# Patient Record
Sex: Female | Born: 1955 | Race: White | Hispanic: No
Health system: Southern US, Community
[De-identification: ages and names within clinical notes are randomized; demographics above are authoritative.]

---

## 2020-05-31 ENCOUNTER — Emergency Department (HOSPITAL_COMMUNITY): Payer: No Typology Code available for payment source

## 2020-05-31 ENCOUNTER — Emergency Department (HOSPITAL_COMMUNITY)
Admission: EM | Admit: 2020-05-31 | Discharge: 2020-05-31 | Disposition: A | Discharge status: Home / Self Care | Payer: No Typology Code available for payment source | Attending: Emergency Medicine | Admitting: Emergency Medicine

## 2020-05-31 ENCOUNTER — Other Ambulatory Visit: Payer: Self-pay

## 2020-05-31 DIAGNOSIS — S80212A Abrasion, left knee, initial encounter: Secondary | ICD-10-CM | POA: Insufficient documentation

## 2020-05-31 DIAGNOSIS — S80211A Abrasion, right knee, initial encounter: Secondary | ICD-10-CM | POA: Diagnosis not present

## 2020-05-31 DIAGNOSIS — Y939 Activity, unspecified: Secondary | ICD-10-CM | POA: Diagnosis not present

## 2020-05-31 DIAGNOSIS — T07XXXA Unspecified multiple injuries, initial encounter: Secondary | ICD-10-CM

## 2020-05-31 DIAGNOSIS — Y929 Unspecified place or not applicable: Secondary | ICD-10-CM | POA: Insufficient documentation

## 2020-05-31 DIAGNOSIS — S39012A Strain of muscle, fascia and tendon of lower back, initial encounter: Secondary | ICD-10-CM | POA: Diagnosis not present

## 2020-05-31 DIAGNOSIS — R519 Headache, unspecified: Secondary | ICD-10-CM | POA: Insufficient documentation

## 2020-05-31 DIAGNOSIS — S50811A Abrasion of right forearm, initial encounter: Secondary | ICD-10-CM | POA: Diagnosis not present

## 2020-05-31 DIAGNOSIS — Y998 Other external cause status: Secondary | ICD-10-CM | POA: Diagnosis not present

## 2020-05-31 MED ORDER — ACETAMINOPHEN 500 MG PO TABS
1000.0000 mg | ORAL_TABLET | Freq: Once | ORAL | Status: AC
  Administered 2020-05-31: 1000 mg via ORAL
  Filled 2020-05-31: qty 2

## 2020-05-31 MED ORDER — HYDROCODONE-ACETAMINOPHEN 5-325 MG PO TABS: 1.0000 | ORAL_TABLET | Freq: Four times a day (QID) | ORAL | 0 refills | Status: DC | PRN

## 2020-05-31 MED ORDER — HYDROCODONE-ACETAMINOPHEN 5-325 MG PO TABS: 1.0000 | ORAL_TABLET | Freq: Four times a day (QID) | ORAL | 0 refills | Status: AC | PRN
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
## 2020-05-31 NOTE — ED Notes (Signed)
Patient transported to CT 

## 2020-05-31 NOTE — ED Notes (Signed)
Discharge instructions discussed with pt. Pt verbalized understanding with no questions at this time. Pt to go home with family at bedside 
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
